# Patient Record
Sex: Female | Born: 1980 | Race: White | Hispanic: No | Marital: Married | Smoking: Never smoker
Health system: Southern US, Community
[De-identification: ages and names within clinical notes are randomized; demographics above are authoritative.]

## PROBLEM LIST (undated history)

## (undated) DIAGNOSIS — F419 Anxiety disorder, unspecified: Secondary | ICD-10-CM

## (undated) DIAGNOSIS — G43909 Migraine, unspecified, not intractable, without status migrainosus: Secondary | ICD-10-CM

## (undated) DIAGNOSIS — R87619 Unspecified abnormal cytological findings in specimens from cervix uteri: Secondary | ICD-10-CM

## (undated) DIAGNOSIS — D649 Anemia, unspecified: Secondary | ICD-10-CM

## (undated) DIAGNOSIS — D219 Benign neoplasm of connective and other soft tissue, unspecified: Secondary | ICD-10-CM

## (undated) DIAGNOSIS — R569 Unspecified convulsions: Secondary | ICD-10-CM

## (undated) DIAGNOSIS — I10 Essential (primary) hypertension: Secondary | ICD-10-CM

## (undated) HISTORY — DX: Unspecified abnormal cytological findings in specimens from cervix uteri: R87.619

## (undated) HISTORY — PX: COLPOSCOPY: SHX161

## (undated) HISTORY — PX: NO PAST SURGERIES: SHX2092

## (undated) HISTORY — DX: Essential (primary) hypertension: I10

## (undated) HISTORY — DX: Migraine, unspecified, not intractable, without status migrainosus: G43.909

## (undated) HISTORY — DX: Anxiety disorder, unspecified: F41.9

## (undated) HISTORY — DX: Anemia, unspecified: D64.9

## (undated) HISTORY — DX: Benign neoplasm of connective and other soft tissue, unspecified: D21.9

---

## 2012-04-01 NOTE — L&D Delivery Note (Signed)
Delivery Note At 4:55 AM a viable and healthy female was delivered via Vaginal, Spontaneous Delivery (Presentation: Left Occiput Anterior).  APGAR: 8, 9; weight .   Placenta status: Intact, Spontaneous.  Cord: 3 vessels with the following complications: None.  Cord pH: na  Anesthesia: Epidural  Episiotomy: None Lacerations: 1st degree Suture Repair: 2.0 3.0 vicryl rapide Est. Blood Loss (mL): 400  Mom to postpartum.  Baby to Couplet care / Skin to Skin.  Christina Lam 02/16/2013, 5:26 AM

## 2012-07-28 LAB — OB RESULTS CONSOLE HIV ANTIBODY (ROUTINE TESTING): HIV: NONREACTIVE

## 2012-07-28 LAB — OB RESULTS CONSOLE HEPATITIS B SURFACE ANTIGEN: Hepatitis B Surface Ag: NEGATIVE

## 2012-07-28 LAB — OB RESULTS CONSOLE RPR: RPR: NONREACTIVE

## 2013-02-08 ENCOUNTER — Telehealth: Payer: Self-pay | Admitting: "Endocrinology

## 2013-02-08 NOTE — Telephone Encounter (Signed)
Other's chart entered in error.

## 2013-02-16 ENCOUNTER — Encounter (HOSPITAL_COMMUNITY): Payer: Managed Care, Other (non HMO) | Admitting: Anesthesiology

## 2013-02-16 ENCOUNTER — Inpatient Hospital Stay (HOSPITAL_COMMUNITY): Payer: Managed Care, Other (non HMO) | Admitting: Anesthesiology

## 2013-02-16 ENCOUNTER — Encounter (HOSPITAL_COMMUNITY): Payer: Self-pay | Admitting: *Deleted

## 2013-02-16 ENCOUNTER — Inpatient Hospital Stay (HOSPITAL_COMMUNITY)
Admission: AD | Admit: 2013-02-16 | Discharge: 2013-02-18 | DRG: 775 | Disposition: A | Discharge status: Home / Self Care | Payer: Managed Care, Other (non HMO) | Source: Ambulatory Visit | Attending: Obstetrics and Gynecology | Admitting: Obstetrics and Gynecology

## 2013-02-16 HISTORY — DX: Unspecified convulsions: R56.9

## 2013-02-16 LAB — CBC
HCT: 38.5 % (ref 36.0–46.0)
Hemoglobin: 14.2 g/dL (ref 12.0–15.0)
MCH: 31.5 pg (ref 26.0–34.0)
MCV: 85.4 fL (ref 78.0–100.0)
RBC: 4.51 MIL/uL (ref 3.87–5.11)
WBC: 10.7 10*3/uL — ABNORMAL HIGH (ref 4.0–10.5)

## 2013-02-16 LAB — RPR: RPR Ser Ql: NONREACTIVE

## 2013-02-16 LAB — ABO/RH: ABO/RH(D): O POS

## 2013-02-16 MED ORDER — LACTATED RINGERS IV SOLN
500.0000 mL | Freq: Once | INTRAVENOUS | Status: AC
  Administered 2013-02-16: 500 mL via INTRAVENOUS

## 2013-02-16 MED ORDER — PHENYLEPHRINE 40 MCG/ML (10ML) SYRINGE FOR IV PUSH (FOR BLOOD PRESSURE SUPPORT)
80.0000 ug | PREFILLED_SYRINGE | INTRAVENOUS | Status: DC | PRN
  Filled 2013-02-16: qty 10

## 2013-02-16 MED ORDER — FLEET ENEMA 7-19 GM/118ML RE ENEM: 1.0000 | ENEMA | RECTAL | Status: DC | PRN

## 2013-02-16 MED ORDER — DIPHENHYDRAMINE HCL 25 MG PO CAPS: 25.0000 mg | ORAL_CAPSULE | Freq: Four times a day (QID) | ORAL | Status: DC | PRN

## 2013-02-16 MED ORDER — SENNOSIDES-DOCUSATE SODIUM 8.6-50 MG PO TABS
2.0000 | ORAL_TABLET | ORAL | Status: DC
  Administered 2013-02-16 – 2013-02-18 (×2): 2 via ORAL
  Filled 2013-02-16 (×2): qty 2

## 2013-02-16 MED ORDER — PHENYLEPHRINE 40 MCG/ML (10ML) SYRINGE FOR IV PUSH (FOR BLOOD PRESSURE SUPPORT): 80.0000 ug | PREFILLED_SYRINGE | INTRAVENOUS | Status: DC | PRN

## 2013-02-16 MED ORDER — OXYCODONE-ACETAMINOPHEN 5-325 MG PO TABS: 1.0000 | ORAL_TABLET | ORAL | Status: DC | PRN

## 2013-02-16 MED ORDER — EPHEDRINE 5 MG/ML INJ
10.0000 mg | INTRAVENOUS | Status: DC | PRN
Start: 2013-02-16 — End: 2013-02-16
  Filled 2013-02-16: qty 4

## 2013-02-16 MED ORDER — DIBUCAINE 1 % RE OINT
1.0000 "application " | TOPICAL_OINTMENT | RECTAL | Status: DC | PRN
  Filled 2013-02-16: qty 28

## 2013-02-16 MED ORDER — IBUPROFEN 600 MG PO TABS
600.0000 mg | ORAL_TABLET | Freq: Four times a day (QID) | ORAL | Status: DC
  Administered 2013-02-16 – 2013-02-18 (×9): 600 mg via ORAL
  Filled 2013-02-16 (×9): qty 1

## 2013-02-16 MED ORDER — ONDANSETRON HCL 4 MG/2ML IJ SOLN: 4.0000 mg | INTRAMUSCULAR | Status: DC | PRN

## 2013-02-16 MED ORDER — ONDANSETRON HCL 4 MG PO TABS: 4.0000 mg | ORAL_TABLET | ORAL | Status: DC | PRN

## 2013-02-16 MED ORDER — IBUPROFEN 600 MG PO TABS: 600.0000 mg | ORAL_TABLET | Freq: Four times a day (QID) | ORAL | Status: DC | PRN

## 2013-02-16 MED ORDER — LACTATED RINGERS IV SOLN
INTRAVENOUS | Status: DC
  Administered 2013-02-16: 125 mL/h via INTRAVENOUS

## 2013-02-16 MED ORDER — METHYLERGONOVINE MALEATE 0.2 MG PO TABS: 0.2000 mg | ORAL_TABLET | ORAL | Status: DC | PRN

## 2013-02-16 MED ORDER — PRENATAL MULTIVITAMIN CH
1.0000 | ORAL_TABLET | Freq: Every day | ORAL | Status: DC
  Administered 2013-02-16 – 2013-02-17 (×2): 1 via ORAL
  Filled 2013-02-16 (×2): qty 1

## 2013-02-16 MED ORDER — LIDOCAINE HCL (PF) 1 % IJ SOLN
INTRAMUSCULAR | Status: DC | PRN
  Administered 2013-02-16 (×2): 9 mL

## 2013-02-16 MED ORDER — TETANUS-DIPHTH-ACELL PERTUSSIS 5-2.5-18.5 LF-MCG/0.5 IM SUSP: 0.5000 mL | Freq: Once | INTRAMUSCULAR | Status: DC

## 2013-02-16 MED ORDER — SIMETHICONE 80 MG PO CHEW: 80.0000 mg | CHEWABLE_TABLET | ORAL | Status: DC | PRN

## 2013-02-16 MED ORDER — DIPHENHYDRAMINE HCL 50 MG/ML IJ SOLN: 12.5000 mg | INTRAMUSCULAR | Status: DC | PRN

## 2013-02-16 MED ORDER — OXYCODONE-ACETAMINOPHEN 5-325 MG PO TABS
1.0000 | ORAL_TABLET | ORAL | Status: DC | PRN
  Administered 2013-02-16 – 2013-02-17 (×5): 1 via ORAL
  Filled 2013-02-16 (×5): qty 1

## 2013-02-16 MED ORDER — CITRIC ACID-SODIUM CITRATE 334-500 MG/5ML PO SOLN: 30.0000 mL | ORAL | Status: DC | PRN

## 2013-02-16 MED ORDER — FENTANYL 2.5 MCG/ML BUPIVACAINE 1/10 % EPIDURAL INFUSION (WH - ANES)
INTRAMUSCULAR | Status: DC | PRN
  Administered 2013-02-16: 14 mL/h via EPIDURAL

## 2013-02-16 MED ORDER — EPHEDRINE 5 MG/ML INJ: 10.0000 mg | INTRAVENOUS | Status: DC | PRN

## 2013-02-16 MED ORDER — LANOLIN HYDROUS EX OINT: TOPICAL_OINTMENT | CUTANEOUS | Status: DC | PRN

## 2013-02-16 MED ORDER — OXYTOCIN BOLUS FROM INFUSION
500.0000 mL | INTRAVENOUS | Status: DC
  Administered 2013-02-16: 500 mL via INTRAVENOUS

## 2013-02-16 MED ORDER — METHYLERGONOVINE MALEATE 0.2 MG/ML IJ SOLN: 0.2000 mg | INTRAMUSCULAR | Status: DC | PRN

## 2013-02-16 MED ORDER — LIDOCAINE HCL (PF) 1 % IJ SOLN
30.0000 mL | INTRAMUSCULAR | Status: DC | PRN
  Filled 2013-02-16: qty 30

## 2013-02-16 MED ORDER — LACTATED RINGERS IV SOLN: 500.0000 mL | INTRAVENOUS | Status: DC | PRN

## 2013-02-16 MED ORDER — ONDANSETRON HCL 4 MG/2ML IJ SOLN: 4.0000 mg | Freq: Four times a day (QID) | INTRAMUSCULAR | Status: DC | PRN

## 2013-02-16 MED ORDER — OXYTOCIN 40 UNITS IN LACTATED RINGERS INFUSION - SIMPLE MED
62.5000 mL/h | INTRAVENOUS | Status: DC
  Filled 2013-02-16: qty 1000

## 2013-02-16 MED ORDER — ZOLPIDEM TARTRATE 5 MG PO TABS: 5.0000 mg | ORAL_TABLET | Freq: Every evening | ORAL | Status: DC | PRN

## 2013-02-16 MED ORDER — WITCH HAZEL-GLYCERIN EX PADS: 1.0000 "application " | MEDICATED_PAD | CUTANEOUS | Status: DC | PRN

## 2013-02-16 MED ORDER — ACETAMINOPHEN 325 MG PO TABS: 650.0000 mg | ORAL_TABLET | ORAL | Status: DC | PRN

## 2013-02-16 MED ORDER — BENZOCAINE-MENTHOL 20-0.5 % EX AERO
1.0000 "application " | INHALATION_SPRAY | CUTANEOUS | Status: DC | PRN
  Filled 2013-02-16: qty 56

## 2013-02-16 MED ORDER — FENTANYL 2.5 MCG/ML BUPIVACAINE 1/10 % EPIDURAL INFUSION (WH - ANES)
14.0000 mL/h | INTRAMUSCULAR | Status: DC | PRN
  Filled 2013-02-16: qty 125

## 2013-02-16 NOTE — Anesthesia Procedure Notes (Signed)
Epidural Patient location during procedure: OB Start time: 02/16/2013 3:09 AM End time: 02/16/2013 3:13 AM  Staffing Anesthesiologist: Leilani Able Performed by: anesthesiologist   Preanesthetic Checklist Completed: patient identified, surgical consent, pre-op evaluation, timeout performed, IV checked, risks and benefits discussed and monitors and equipment checked  Epidural Patient position: sitting Prep: site prepped and draped and DuraPrep Patient monitoring: continuous pulse ox and blood pressure Approach: midline Injection technique: LOR air  Needle:  Needle type: Tuohy  Needle gauge: 17 G Needle length: 9 cm and 9 Needle insertion depth: 5 cm cm Catheter type: closed end flexible Catheter size: 19 Gauge Catheter at skin depth: 10 cm Test dose: negative and Other  Assessment Sensory level: T9 Events: blood not aspirated, injection not painful, no injection resistance, negative IV test and no paresthesia  Additional Notes Reason for block:procedure for pain

## 2013-02-16 NOTE — MAU Note (Signed)
PT SAYS SHE HAS BEEN HURTING BAD 10PM.,  HAD APPOINTMENT  TODAY  VE- 2-3 CM- STRIPPED MEMBRANES.  DENIES HSV AND MRSA.

## 2013-02-16 NOTE — Progress Notes (Signed)
Patient ID: Christina Lam, female   DOB: March 24, 1981, 32 y.o.   MRN: [id number] INTERVAL NOTE:  S:   Lying in bed maintaining skin-to-skin with infant, min cramping, (+) voids, small bleed, denies HA/NV/dizziness  O:   VSS, AAO x 3, NAD  Fundus U  Scant lochia  A / P:   PPD #0  Stable post partum  Routine PP orders  Kenard Gower, MSN, CNM 02/16/2013, 9:13 AM

## 2013-02-16 NOTE — Anesthesia Postprocedure Evaluation (Signed)
  Anesthesia Post-op Note  Patient: Christina Lam  Procedure(s) Performed: * No procedures listed *  Patient Location: Mother/Baby  Anesthesia Type:Epidural  Level of Consciousness: awake, alert , oriented and patient cooperative  Airway and Oxygen Therapy: Patient Spontanous Breathing  Post-op Pain: mild  Post-op Assessment: Patient's Cardiovascular Status Stable, Respiratory Function Stable, Patent Airway, No signs of Nausea or vomiting, Adequate PO intake and Pain level controlled  Post-op Vital Signs: stable  Complications: No apparent anesthesia complications

## 2013-02-16 NOTE — H&P (Signed)
Christina Lam is a 32 y.o. female presenting for labor. Maternal Medical History:  Reason for admission: Contractions.   Contractions: Onset was less than 1 hour ago.   Frequency: regular.   Perceived severity is moderate.    Fetal activity: Perceived fetal activity is normal.   Last perceived fetal movement was within the past hour.    Prenatal Complications - Diabetes: none.    OB History   Grav Para Term Preterm Abortions TAB SAB Ect Mult Living   3 2 2       2      Past Medical History  Diagnosis Date  . Seizures     as child   Past Surgical History  Procedure Laterality Date  . No past surgeries     Family History: family history is not on file. Social History:  reports that she has never smoked. She has never used smokeless tobacco. She reports that she does not drink alcohol or use illicit drugs.   Prenatal Transfer Tool  Maternal Diabetes: No Genetic Screening: Normal Maternal Ultrasounds/Referrals: Normal Fetal Ultrasounds or other Referrals:  None Maternal Substance Abuse:  No Significant Maternal Medications:  None Significant Maternal Lab Results:  None Other Comments:  None  Review of Systems  Unable to perform ROS All other systems reviewed and are negative.    Dilation: 10 Effacement (%): 100 Station: +1 Exam by:: Lucas Mallow, RN Blood pressure 131/76, pulse 77, temperature 97.7 F (36.5 C), temperature source Oral, resp. rate 18, height 5' 4.5" (1.638 m), weight 84.029 kg (185 lb 4 oz), SpO2 100.00%. Maternal Exam:  Uterine Assessment: Contraction strength is moderate.  Contraction frequency is regular.   Abdomen: Patient reports no abdominal tenderness. Fetal presentation: vertex  Introitus: Normal vulva. Normal vagina.  Ferning test: not done.  Nitrazine test: not done. Amniotic fluid character: not assessed.  Pelvis: adequate for delivery.   Cervix: Cervix evaluated by digital exam.     Physical Exam  Nursing note and vitals  reviewed. Constitutional: She appears well-developed and well-nourished.  HENT:  Head: Normocephalic and atraumatic.  Eyes: Pupils are equal, round, and reactive to light.  Cardiovascular: Normal rate.   Respiratory: Effort normal.  GI: Soft.  Genitourinary: Vagina normal and uterus normal.  Musculoskeletal: Normal range of motion.  Neurological: She is alert.  Skin: Skin is warm and dry.    Prenatal labs: ABO, Rh: O/Positive/-- (11/18 0000) Antibody: Negative (11/18 0000) Rubella: Immune (04/29 0000) RPR: Nonreactive (04/29 0000)  HBsAg: Negative (04/29 0000)  HIV: Non-reactive (04/29 0000)  GBS: Negative (11/18 0000)   Assessment/Plan: Active labor Admit   Arch Methot J 02/16/2013, 5:24 AM

## 2013-02-16 NOTE — Anesthesia Preprocedure Evaluation (Signed)
Anesthesia Evaluation  Patient identified by MRN, date of birth, ID band Patient awake    Reviewed: Allergy & Precautions, H&P , NPO status , Patient's Chart, lab work & pertinent test results  Airway Mallampati: I TM Distance: >3 FB Neck ROM: full    Dental no notable dental hx.    Pulmonary neg pulmonary ROS,    Pulmonary exam normal       Cardiovascular negative cardio ROS      Neuro/Psych negative psych ROS   GI/Hepatic negative GI ROS, Neg liver ROS,   Endo/Other  negative endocrine ROS  Renal/GU negative Renal ROS     Musculoskeletal negative musculoskeletal ROS (+)   Abdominal Normal abdominal exam  (+)   Peds  Hematology negative hematology ROS (+)   Anesthesia Other Findings   Reproductive/Obstetrics (+) Pregnancy                           Anesthesia Physical Anesthesia Plan  ASA: II  Anesthesia Plan: Epidural   Post-op Pain Management:    Induction:   Airway Management Planned:   Additional Equipment:   Intra-op Plan:   Post-operative Plan:   Informed Consent: I have reviewed the patients History and Physical, chart, labs and discussed the procedure including the risks, benefits and alternatives for the proposed anesthesia with the patient or authorized representative who has indicated his/her understanding and acceptance.     Plan Discussed with:   Anesthesia Plan Comments:         Anesthesia Quick Evaluation  

## 2013-02-16 NOTE — Lactation Note (Signed)
This note was copied from the chart of Christina Lam. Lactation Consultation Note  Patient Name: Christina Lam [id number] Date: 02/16/2013 Reason for consult: Follow-up assessment Mom called with feeding cues after baby  Had a bath .  LC assessed breast tissue , noticed erect nipples , semi compress able areolas bilaterally.  baby has a high palate and recessed chin and moms tissue won't greet baby's hard palate. Sized mom for a #20 nipple shield ( noted to be a good fit , once the areola is more compress  able the #24 Nipple shield will accommodate the areola better. Baby latched for a few sucks and  Released. Per mom I feel like I just want to pump and bottle feed - LC set up a DEBP with instructions. And cleaning. Also mentioned to mom if she decided to latch to have the RN or  LC demo the curved tip  syringe with EBM or formula in the top of the NS and latch. Mentioned to mom she may find the baby latches  When there is incentive to suck . And post pump for 15-20 mins both breast together. @ this point baby sound asleep , LC recommended by 16 hours if the baby has not fed to have RN or LC spoon feed ,  Use the Nipple shield with EBM or formula or bottle . LC stressed the importance to [place] e the technique to feed that makes  her most comfortable and we the staff would support her. Mom seemed relieved .      Maternal Data Has patient been taught Hand Expression?: Yes  Feeding Feeding Type: Breast Fed  LATCH Score/Interventions                Intervention(s): Breastfeeding basics reviewed     Lactation Tools Discussed/Used Tools: Shells;Pump;Nipple Dorris Carnes (mom requested setting up a DEBP ) Nipple shield size: 20;24 (20 fits well , when areola more compress able #24 NS will fit better ) Shell Type: Inverted Breast pump type: Double-Electric Breast Pump (mom also wants to rent a pump ) Pump Review: Setup, frequency, and cleaning Initiated by:: MAI  Date initiated::  02/16/13   Consult Status Consult Status: Follow-up (see LC note ) Date: 02/16/13 Follow-up type: In-patient    Kathrin Greathouse 02/16/2013, 4:43 PM

## 2013-02-17 LAB — CBC
HCT: 35 % — ABNORMAL LOW (ref 36.0–46.0)
MCH: 30.8 pg (ref 26.0–34.0)
MCHC: 35.7 g/dL (ref 30.0–36.0)
MCV: 86.2 fL (ref 78.0–100.0)
Platelets: 141 10*3/uL — ABNORMAL LOW (ref 150–400)
RDW: 14.2 % (ref 11.5–15.5)

## 2013-02-17 NOTE — Progress Notes (Signed)
PPD 1 SVD  S:  Reports feeling well, just sore with breastfeeding             Tolerating po/ No nausea or vomiting             Bleeding is moderate             Pain controlled withMotrin and Percocet             Up ad lib / ambulatory / voiding QS  Newborn breast feeding  / Circumcision complete  O:               VS: BP 111/70  Pulse 71  Temp(Src) 98 F (36.7 C) (Oral)  Resp 18  Ht 5' 4.5" (1.638 m)  Wt 84.029 kg (185 lb 4 oz)  BMI 31.32 kg/m2  SpO2 100%   LABS:              Recent Labs  02/16/13 0225 02/17/13 0545  WBC 10.7* 11.3*  HGB 14.2 12.5  PLT 143* 141*               Blood type: --/--/O POS (11/18 0158)  Rubella: Immune (04/29 0000)                                 Physical Exam:             Alert and oriented X3  Lungs: Clear and unlabored  Heart: regular rate and rhythm / no mumurs  Abdomen: soft, non-tender, non-distended              Fundus: firm, non-tender, U-1  Perineum: no edema  Lochia: moderate to light rubra, no clots  Extremities: no edema, no calf pain or tenderness    A: PPD # 1   Doing well - stable status  P: Routine post partum orders  Encourage voiding q2 hr to support uterine tone  Anticipate d/c home tomorrow    Cyndee Brightly SNM 02/17/2013, 10:38 AM

## 2013-02-17 NOTE — Progress Notes (Signed)
Seen and agree - Terriann Difonzo CNM FACNM 

## 2013-02-18 ENCOUNTER — Encounter (HOSPITAL_COMMUNITY)
Admission: RE | Admit: 2013-02-18 | Discharge: 2013-02-18 | Disposition: A | Discharge status: Home / Self Care | Payer: Managed Care, Other (non HMO) | Source: Ambulatory Visit | Attending: Obstetrics & Gynecology | Admitting: Obstetrics & Gynecology

## 2013-02-18 DIAGNOSIS — O923 Agalactia: Secondary | ICD-10-CM | POA: Insufficient documentation

## 2013-02-18 MED ORDER — IBUPROFEN 600 MG PO TABS: 600.0000 mg | ORAL_TABLET | Freq: Four times a day (QID) | ORAL | Status: DC

## 2013-02-18 NOTE — Discharge Summary (Signed)
Obstetric Discharge Summary Reason for Admission: onset of labor Prenatal Procedures: ultrasound Intrapartum Procedures: spontaneous vaginal delivery Postpartum Procedures: none Complications-Operative and Postpartum: 1st degree perineal laceration Hemoglobin  Date Value Range Status  02/17/2013 12.5  12.0 - 15.0 g/dL Final     HCT  Date Value Range Status  02/17/2013 35.0* 36.0 - 46.0 % Final    Physical Exam:  General: alert and cooperative Lochia: appropriate Uterine Fundus: firm Incision: healing well, no significant drainage, no dehiscence, no significant erythema DVT Evaluation: No evidence of DVT seen on physical exam. Negative Homan's sign.  Discharge Diagnoses: Term Pregnancy-delivered  Discharge Information: Date: 02/18/2013 Activity: pelvic rest Diet: routine Medications: PNV and Ibuprofen Condition: stable Instructions: refer to practice specific booklet Discharge to: home Follow-up Information   Follow up with LAVOIE,MARIE-LYNE, MD. Schedule an appointment as soon as possible for a visit in 6 weeks.   Specialty:  Obstetrics and Gynecology   Contact information:   [address] [place] [address] [phone]       Newborn Data: Live born female on 02/16/2013 Birth Weight: 7 lb 3 oz (3260 g) APGAR: 8, 9  Home with mother.  Claris Pech, N 02/18/2013, 8:57 AM

## 2013-02-18 NOTE — Progress Notes (Signed)
PPD #2- SVD  Subjective:   Reports feeling tired Tolerating po/ No nausea or vomiting Bleeding is light Pain controlled with Motrin Up ad lib / ambulatory / voiding without problems Newborn: breastfeeding and pumping / Circumcision: done   Objective:   VS: VS:  Filed Vitals:   02/16/13 2000 02/17/13 0532 02/17/13 1800 02/18/13 0525  BP: 110/73 111/70 128/93 112/69  Pulse: 72 71 72 78  Temp: 98.6 F (37 C) 98 F (36.7 C) 97.8 F (36.6 C) 97.4 F (36.3 C)  TempSrc: Oral Oral Oral Oral  Resp: 18 18 18 16   Height:      Weight:      SpO2: 100%  100%     LABS:  Recent Labs  02/16/13 0225 02/17/13 0545  WBC 10.7* 11.3*  HGB 14.2 12.5  PLT 143* 141*   Blood type: --/--/O POS (11/18 0158) Rubella: Immune (04/29 0000)                I&O: Intake/Output   None     Physical Exam: Alert and oriented X3 Abdomen: soft, non-tender, non-distended  Fundus: firm, non-tender, U-1 Perineum: Well approximated, no significant erythema, edema, or drainage; healing well. Lochia: small Extremities: no edema, no calf pain or tenderness    Assessment: PPD # 2 [id number]/ S/P:SVD, 1st degree laceration Doing well - stable for discharge home   Plan: Discharge home RX's:  Ibuprofen 600mg  po Q 6 hrs prn pain #30 Refill x 0 Routine pp visit in [facility] booklet given    Donette Larry, N MSN, CNM 02/18/2013, 8:55 AM

## 2014-01-31 ENCOUNTER — Encounter (HOSPITAL_COMMUNITY): Payer: Self-pay | Admitting: *Deleted

## 2015-10-05 ENCOUNTER — Encounter (HOSPITAL_COMMUNITY): Payer: Self-pay

## 2015-10-05 ENCOUNTER — Emergency Department (HOSPITAL_COMMUNITY): Payer: Managed Care, Other (non HMO)

## 2015-10-05 ENCOUNTER — Emergency Department (HOSPITAL_COMMUNITY)
Admission: EM | Admit: 2015-10-05 | Discharge: 2015-10-06 | Disposition: A | Discharge status: Home / Self Care | Payer: Managed Care, Other (non HMO) | Attending: Emergency Medicine | Admitting: Emergency Medicine

## 2015-10-05 DIAGNOSIS — Z7982 Long term (current) use of aspirin: Secondary | ICD-10-CM | POA: Insufficient documentation

## 2015-10-05 DIAGNOSIS — R1031 Right lower quadrant pain: Secondary | ICD-10-CM | POA: Diagnosis not present

## 2015-10-05 DIAGNOSIS — R112 Nausea with vomiting, unspecified: Secondary | ICD-10-CM

## 2015-10-05 DIAGNOSIS — R197 Diarrhea, unspecified: Secondary | ICD-10-CM | POA: Diagnosis not present

## 2015-10-05 DIAGNOSIS — R109 Unspecified abdominal pain: Secondary | ICD-10-CM | POA: Diagnosis present

## 2015-10-05 LAB — LIPASE, BLOOD: Lipase: 23 U/L (ref 11–51)

## 2015-10-05 LAB — PREGNANCY, URINE: PREG TEST UR: NEGATIVE

## 2015-10-05 LAB — URINE MICROSCOPIC-ADD ON

## 2015-10-05 LAB — COMPREHENSIVE METABOLIC PANEL
ALBUMIN: 3.8 g/dL (ref 3.5–5.0)
ALK PHOS: 48 U/L (ref 38–126)
ALT: 12 U/L — AB (ref 14–54)
AST: 21 U/L (ref 15–41)
Anion gap: 8 (ref 5–15)
BILIRUBIN TOTAL: 0.5 mg/dL (ref 0.3–1.2)
BUN: 10 mg/dL (ref 6–20)
CALCIUM: 8.8 mg/dL — AB (ref 8.9–10.3)
CO2: 24 mmol/L (ref 22–32)
CREATININE: 0.85 mg/dL (ref 0.44–1.00)
Chloride: 105 mmol/L (ref 101–111)
GFR calc Af Amer: >60 mL/min (ref >60)
GFR calc non Af Amer: >60 mL/min (ref >60)
GLUCOSE: 89 mg/dL (ref 65–99)
Potassium: 3.2 mmol/L — ABNORMAL LOW (ref 3.5–5.1)
Sodium: 137 mmol/L (ref 135–145)
TOTAL PROTEIN: 7.2 g/dL (ref 6.5–8.1)

## 2015-10-05 LAB — URINALYSIS, ROUTINE W REFLEX MICROSCOPIC
Glucose, UA: NEGATIVE mg/dL
KETONES UR: 40 mg/dL — AB
Leukocytes, UA: NEGATIVE
NITRITE: NEGATIVE
PH: 6 (ref 5.0–8.0)
Protein, ur: 100 mg/dL — AB
Specific Gravity, Urine: 1.029 (ref 1.005–1.030)

## 2015-10-05 LAB — WET PREP, GENITAL
CLUE CELLS WET PREP: NONE SEEN
Sperm: NONE SEEN
Trich, Wet Prep: NONE SEEN
Yeast Wet Prep HPF POC: NONE SEEN

## 2015-10-05 LAB — CBC
HEMATOCRIT: 34.2 % — AB (ref 36.0–46.0)
Hemoglobin: 10.9 g/dL — ABNORMAL LOW (ref 12.0–15.0)
MCH: 23 pg — ABNORMAL LOW (ref 26.0–34.0)
MCHC: 31.9 g/dL (ref 30.0–36.0)
MCV: 72.3 fL — ABNORMAL LOW (ref 78.0–100.0)
PLATELETS: 171 10*3/uL (ref 150–400)
RBC: 4.73 MIL/uL (ref 3.87–5.11)
RDW: 17.5 % — AB (ref 11.5–15.5)
WBC: 4.4 10*3/uL (ref 4.0–10.5)

## 2015-10-05 MED ORDER — ACETAMINOPHEN 500 MG PO TABS
1000.0000 mg | ORAL_TABLET | Freq: Once | ORAL | Status: AC
  Administered 2015-10-05: 1000 mg via ORAL
  Filled 2015-10-05: qty 2

## 2015-10-05 MED ORDER — ONDANSETRON 4 MG PO TBDP: 4.0000 mg | ORAL_TABLET | Freq: Once | ORAL | Status: DC | PRN

## 2015-10-05 MED ORDER — DEXTROSE 5 % IV SOLN
1.0000 g | Freq: Once | INTRAVENOUS | Status: AC
  Administered 2015-10-05: 1 g via INTRAVENOUS
  Filled 2015-10-05: qty 10

## 2015-10-05 MED ORDER — ONDANSETRON HCL 4 MG/2ML IJ SOLN
4.0000 mg | INTRAMUSCULAR | Status: AC
  Administered 2015-10-05: 4 mg via INTRAVENOUS
  Filled 2015-10-05: qty 2

## 2015-10-05 MED ORDER — SODIUM CHLORIDE 0.9 % IV BOLUS (SEPSIS)
1000.0000 mL | Freq: Once | INTRAVENOUS | Status: AC
  Administered 2015-10-05: 1000 mL via INTRAVENOUS

## 2015-10-05 NOTE — ED Provider Notes (Signed)
CSN: [id number]     Arrival date & time 10/05/15  1753 History   First MD Initiated Contact with Patient 10/05/15 1954     Chief Complaint  Patient presents with  . Flank Pain  . Nausea    Christina Lam is a 35 y.o. female who presents to the emergency department complaining of 2 days of nausea, vomiting and diarrhea. She reports associated fevers and chills. Patient reports today she began having right-sided back pain that is nonradiating and constant. Patient reports she has vomited twice today and had 3 loose stools. She denies any abdominal pain. No previous abdominal surgeries. She reports her menstrual cycle began 2 days ago. She reports this menstrual cycle seems earlier than usual. No vaginal discharge. Patient last had ibuprofen yesterday. No treatments today. She denies urinary symptoms, hematuria, history kidney stones, chest pain, shortness of breath, abdominal pain, hematemesis, hematochezia or rashes.   Patient is a 35 y.o. female presenting with flank pain. The history is provided by the patient. No language interpreter was used.  Flank Pain Associated symptoms include chills, a fever, nausea and vomiting. Pertinent negatives include no abdominal pain, chest pain, congestion, coughing, headaches, neck pain, rash or sore throat.    Past Medical History  Diagnosis Date  . Seizures ([place])     as child  . Postpartum care following vaginal delivery (11/18) 02/16/2013   Past Surgical History  Procedure Laterality Date  . No past surgeries     No family history on file. Social History  Substance Use Topics  . Smoking status: Never Smoker   . Smokeless tobacco: Never Used  . Alcohol Use: Yes   OB History    Gravida Para Term Preterm AB TAB SAB Ectopic Multiple Living   3 3 3       3      Review of Systems  Constitutional: Positive for fever and chills.  HENT: Negative for congestion and sore throat.   Eyes: Negative for visual disturbance.  Respiratory: Negative for cough  and shortness of breath.   Cardiovascular: Negative for chest pain.  Gastrointestinal: Positive for nausea, vomiting and diarrhea. Negative for abdominal pain and blood in stool.  Genitourinary: Positive for vaginal bleeding. Negative for dysuria, urgency, frequency, flank pain, vaginal discharge and difficulty urinating.  Musculoskeletal: Positive for back pain. Negative for neck pain.  Skin: Negative for rash.  Neurological: Negative for light-headedness and headaches.      Allergies  Penicillins  Home Medications   Prior to Admission medications   Medication Sig Start Date End Date Taking? Authorizing Provider  aspirin-acetaminophen-caffeine (EXCEDRIN MIGRAINE) [phone] MG tablet Take 1-2 tablets by mouth every 6 (six) hours as needed for headache or migraine.   Yes Historical Provider, MD  etonogestrel (NEXPLANON) 68 MG IMPL implant 1 each by Subdermal route once.   Yes Historical Provider, MD  ibuprofen (ADVIL,MOTRIN) 200 MG tablet Take 800 mg by mouth every 6 (six) hours as needed for headache, mild pain, moderate pain or cramping.   Yes Historical Provider, MD  ibuprofen (ADVIL,MOTRIN) 600 MG tablet Take 1 tablet (600 mg total) by mouth every 6 (six) hours. Patient not taking: Reported on 10/05/2015 02/18/13   Julianne Handler, CNM   BP 118/68 mmHg  Pulse 61  Temp(Src) 100 F (37.8 C) (Oral)  Resp 16  Ht 5\' 5"  (1.651 m)  Wt 72.576 kg  BMI 26.63 kg/m2  SpO2 99%  LMP 10/04/2015 (Exact Date) Physical Exam  Constitutional: She appears well-developed and well-nourished.  No distress.  Nontoxic appearing.  HENT:  Head: Normocephalic and atraumatic.  Mouth/Throat: Oropharynx is clear and moist.  Eyes: Conjunctivae are normal. Pupils are equal, round, and reactive to light. Right eye exhibits no discharge. Left eye exhibits no discharge.  Neck: Neck supple.  Cardiovascular: Normal rate, regular rhythm, normal heart sounds and intact distal pulses.  Exam reveals no gallop and no  friction rub.   No murmur heard. Pulmonary/Chest: Effort normal and breath sounds normal. No respiratory distress. She has no wheezes. She has no rales.  Abdominal: Soft. Bowel sounds are normal. She exhibits no distension and no mass. There is tenderness. There is no rebound and no guarding.  Abdomen is soft. Bowel sounds are present. Patient has mild right lower quadrant tenderness to palpation in right CVA tenderness to palpation. No rebound tenderness. No Rovsing sign. No psoas or obturator sign.  Genitourinary: No vaginal discharge found.  Pelvic exam performed by me with female PA-C chaperone. Scant vaginal bleeding. No vaginal discharge. Cervix is closed. No cervical motion tenderness. No adnexal tenderness or fullness. No suprapubic tenderness. No external rashes or lesions noted.  Musculoskeletal: She exhibits no edema.  Lymphadenopathy:    She has no cervical adenopathy.  Neurological: She is alert. Coordination normal.  Skin: Skin is warm and dry. No rash noted. She is not diaphoretic. No erythema. No pallor.  Psychiatric: She has a normal mood and affect. Her behavior is normal.  Nursing note and vitals reviewed.   ED Course  Procedures (including critical care time) Labs Review Labs Reviewed  WET PREP, GENITAL - Abnormal; Notable for the following:    WBC, Wet Prep HPF POC MANY (*)    All other components within normal limits  COMPREHENSIVE METABOLIC PANEL - Abnormal; Notable for the following:    Potassium 3.2 (*)    Calcium 8.8 (*)    ALT 12 (*)    All other components within normal limits  CBC - Abnormal; Notable for the following:    Hemoglobin 10.9 (*)    HCT 34.2 (*)    MCV 72.3 (*)    MCH 23.0 (*)    RDW 17.5 (*)    All other components within normal limits  URINALYSIS, ROUTINE W REFLEX MICROSCOPIC (NOT AT [facility]) - Abnormal; Notable for the following:    Hgb urine dipstick SMALL (*)    Bilirubin Urine SMALL (*)    Ketones, ur 40 (*)    Protein, ur 100 (*)     All other components within normal limits  URINE MICROSCOPIC-ADD ON - Abnormal; Notable for the following:    Squamous Epithelial / LPF 0-5 (*)    Bacteria, UA MANY (*)    All other components within normal limits  URINE CULTURE  LIPASE, BLOOD  PREGNANCY, URINE  GC/CHLAMYDIA PROBE AMP ([place]) NOT AT [facility]    Imaging Review US Renal  10/05/2015  CLINICAL DATA:  Right flank pain.  Nausea and vomiting. EXAM: RENAL / URINARY TRACT ULTRASOUND COMPLETE COMPARISON:  None. FINDINGS: Right Kidney: Length: 11.2 cm. Echogenicity within normal limits. No mass or hydronephrosis visualized. No shadowing echogenic stone. Left Kidney: Length: 10.1 cm. Echogenicity within normal limits. No mass or hydronephrosis visualized. No shadowing echogenic stone. Bladder: Appears normal for degree of bladder distention. Both ureteral jets are visualized. IMPRESSION: Normal renal ultrasound. Electronically Signed   By: Jeb Levering M.D.   On: 10/05/2015 22:05   I have personally reviewed and evaluated these images and lab results as part  of my medical decision-making.   EKG Interpretation None      Filed Vitals:   10/05/15 2230 10/06/15 0042 10/06/15 0045 10/06/15 0100  BP: 121/73 112/75 122/68 118/68  Pulse: 74 62 62 61  Temp:      TempSrc:      Resp:    16  Height:      Weight:      SpO2: 99% 98% 99% 99%     MDM   Meds given in ED:  Medications  iopamidol (ISOVUE-300) 61 % injection (not administered)  sodium chloride 0.9 % bolus 1,000 mL (0 mLs Intravenous Stopped 10/05/15 2210)  acetaminophen (TYLENOL) tablet 1,000 mg (1,000 mg Oral Given 10/05/15 2035)  ondansetron (ZOFRAN) injection 4 mg (4 mg Intravenous Given 10/05/15 2045)  cefTRIAXone (ROCEPHIN) 1 g in dextrose 5 % 50 mL IVPB (0 g Intravenous Stopped 10/05/15 2124)    New Prescriptions   No medications on file    Final diagnoses:  Right flank pain  Right lower quadrant abdominal pain  Nausea vomiting and diarrhea   This is a 35  y.o. female who presents to the emergency department complaining of 2 days of nausea, vomiting and diarrhea. She reports associated fevers and chills. Patient reports today she began having right-sided back pain that is nonradiating and constant. Patient reports she has vomited twice today and had 3 loose stools. She denies any abdominal pain. No previous abdominal surgeries. She reports her menstrual cycle began 2 days ago. She reports this menstrual cycle seems earlier than usual. No vaginal discharge. Patient last had ibuprofen yesterday. No treatments today. She denies urinary symptoms, hematuria, history kidney stones.  On arrival to the emergency department the patient has a temperature of 100.0. On my evaluation the patient is nontoxic appearing. Her abdomen is soft and she has a right lower quadrant tenderness to palpation. She has right CVA tenderness. Pregnancy test is negative. Urinalysis is nitrite and leukocyte negative with many bacteria and 0-5 white blood cells. Lipase is within normal limits. CBC and CMP are unremarkable. Renal ultrasound is unremarkable. Pelvic exam is unremarkable. On repeat exams patient has no right lower quadrant or right adnexal tenderness. No cervical motion tenderness. Wet prep reveals many white blood cells. Will obtain CT abdomen and pelvis with contrast to rule out appendicitis versus colitis or other acute abnormality. At shift change patient is awaiting CT abdomen and pelvis. If this exam is unremarkable I would likely treat with Keflex 4 times a day for 10 days for possible pyelonephritis. Patient care handed off to Dr. Venora Maples who will disposition the patient accordingly after CT scan.      Waynetta Pean, PA-C 10/06/15 0134  Jola Schmidt, MD 10/06/15 [place], MD 10/06/15 0530

## 2015-10-05 NOTE — ED Notes (Signed)
Per Pt, pt started to have nausea, vomiting, diarrhea starting yesterday morning. Reports continuing throughout the last day. Pt reports to have started to have left lower back pain this morning.

## 2015-10-05 NOTE — ED Notes (Signed)
Patient transported to Ultrasound 

## 2015-10-06 ENCOUNTER — Emergency Department (HOSPITAL_COMMUNITY): Payer: Managed Care, Other (non HMO)

## 2015-10-06 LAB — GC/CHLAMYDIA PROBE AMP (~~LOC~~) NOT AT ARMC
CHLAMYDIA, DNA PROBE: NEGATIVE
Neisseria Gonorrhea: NEGATIVE

## 2015-10-06 MED ORDER — IOPAMIDOL (ISOVUE-300) INJECTION 61%
INTRAVENOUS | Status: AC
  Administered 2015-10-06: 100 mL
  Filled 2015-10-06: qty 100

## 2015-10-06 MED ORDER — ACETAMINOPHEN 500 MG PO TABS
1000.0000 mg | ORAL_TABLET | Freq: Once | ORAL | Status: AC
  Administered 2015-10-06: 1000 mg via ORAL
  Filled 2015-10-06: qty 2

## 2015-10-06 MED ORDER — IBUPROFEN 600 MG PO TABS: 600.0000 mg | ORAL_TABLET | Freq: Three times a day (TID) | ORAL | Status: AC | PRN

## 2015-10-06 MED ORDER — ONDANSETRON 8 MG PO TBDP: 8.0000 mg | ORAL_TABLET | Freq: Three times a day (TID) | ORAL | Status: AC | PRN

## 2015-10-06 NOTE — Discharge Instructions (Signed)

## 2015-10-06 NOTE — ED Notes (Signed)
Patient transported to Ultrasound 

## 2015-10-06 NOTE — ED Notes (Signed)
Patient transported to CT 

## 2015-10-07 LAB — URINE CULTURE: Culture: 1000 — AB

## 2016-11-01 IMAGING — US US ABDOMEN LIMITED
1 series · 14 of 25 positions shown · non-contrast
Comparison: CT of the abdomen and pelvis performed earlier today at
[DATE] a.m.

CLINICAL DATA: Acute onset of right upper quadrant abdominal pain.
Initial encounter.

EXAM:
US ABDOMEN LIMITED - RIGHT UPPER QUADRANT

[Series 1: us abdomen limited · 0.22mm/px · 14 of 33 slices shown]
[im 1/33]
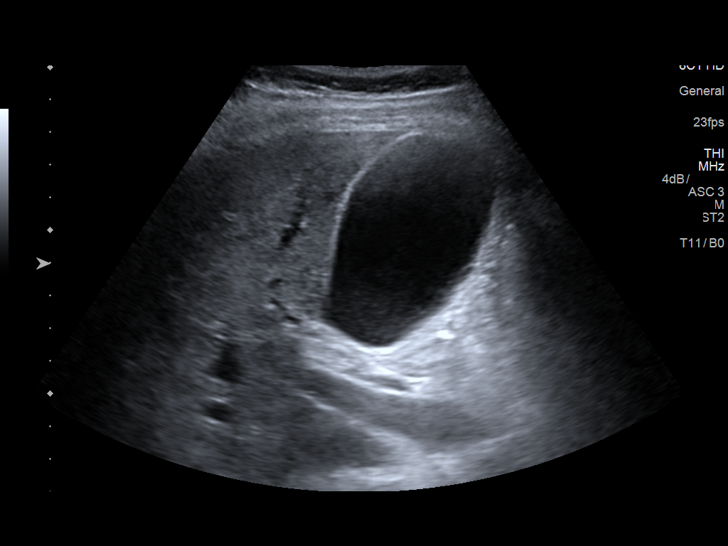
[im 3/33]
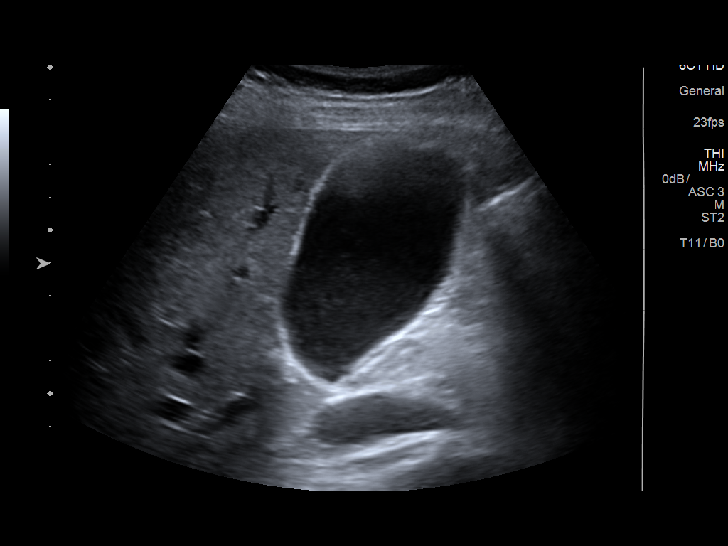
[im 6/33]
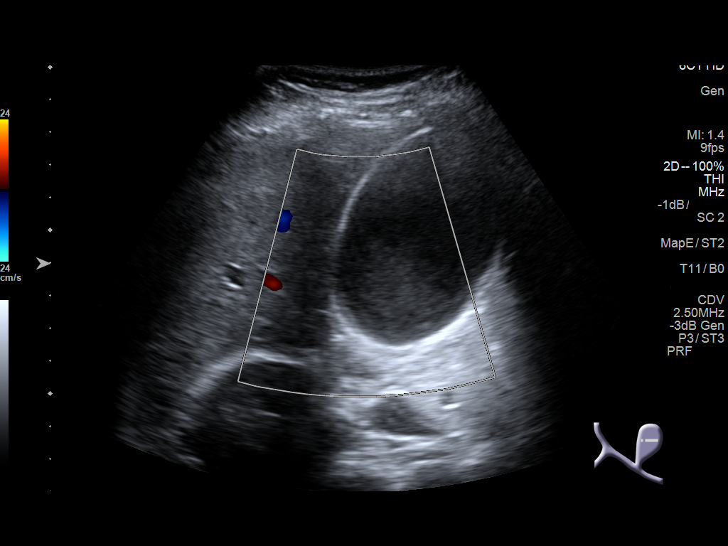
[im 9/33]
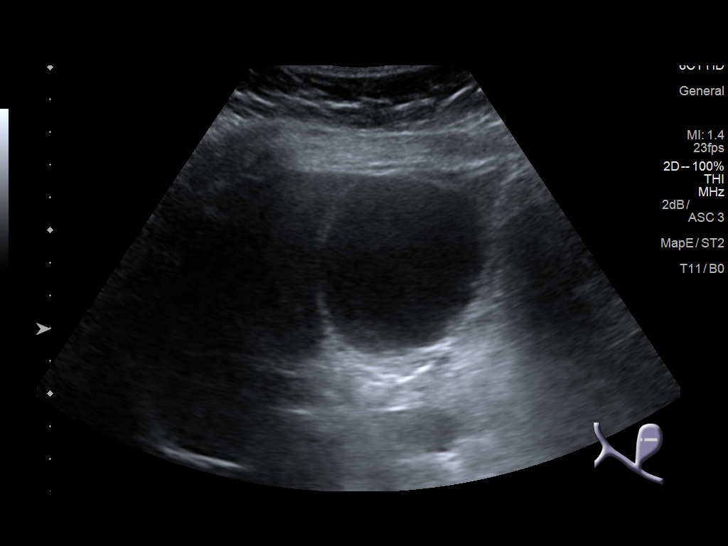
[im 11/33]
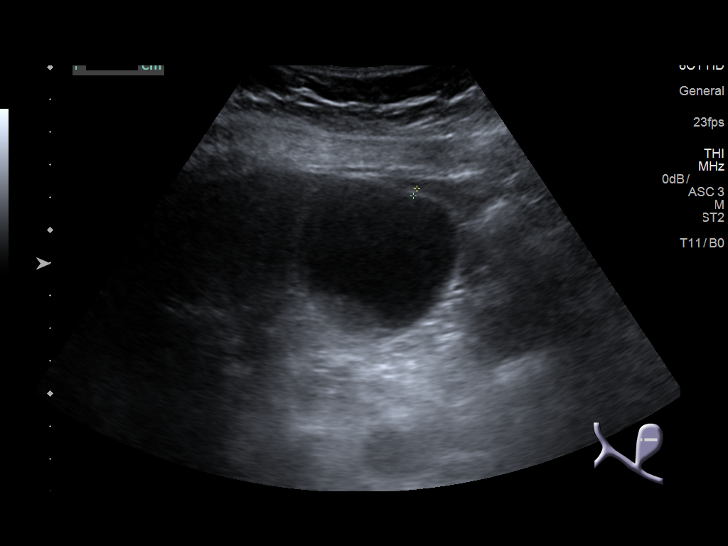
[im 13/33]
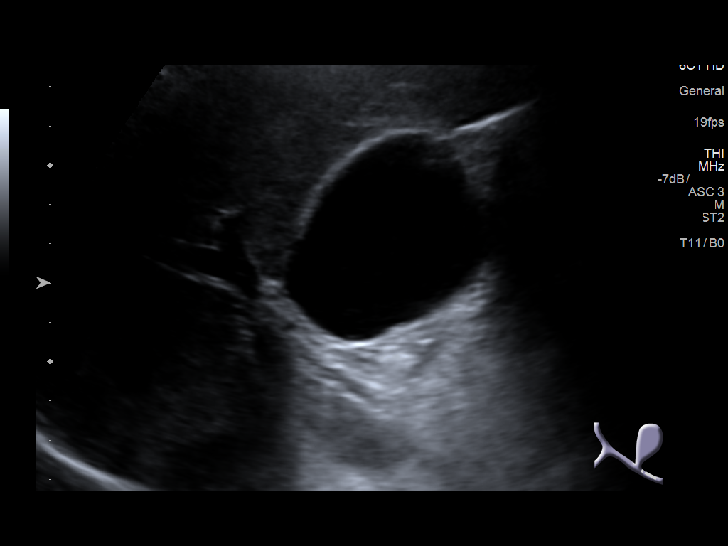
[im 15/33]
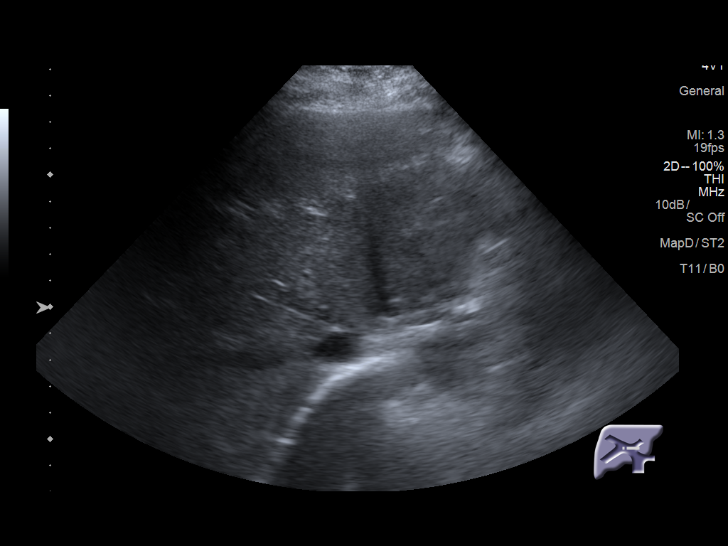
[im 18/33]
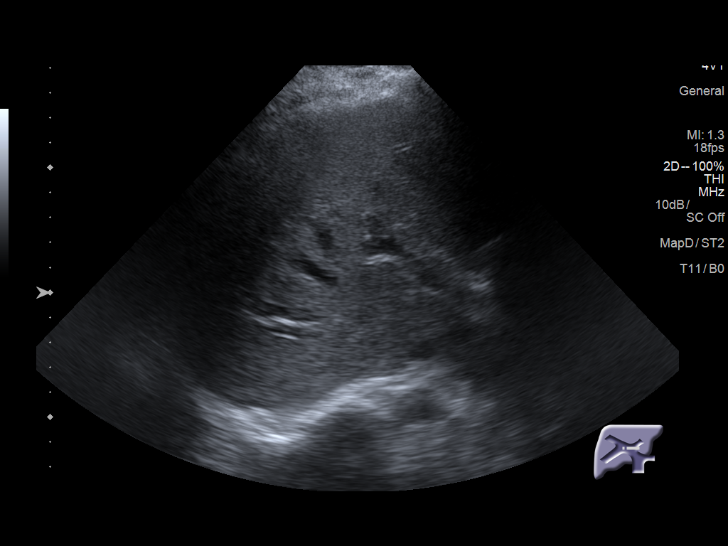
[im 21/33]
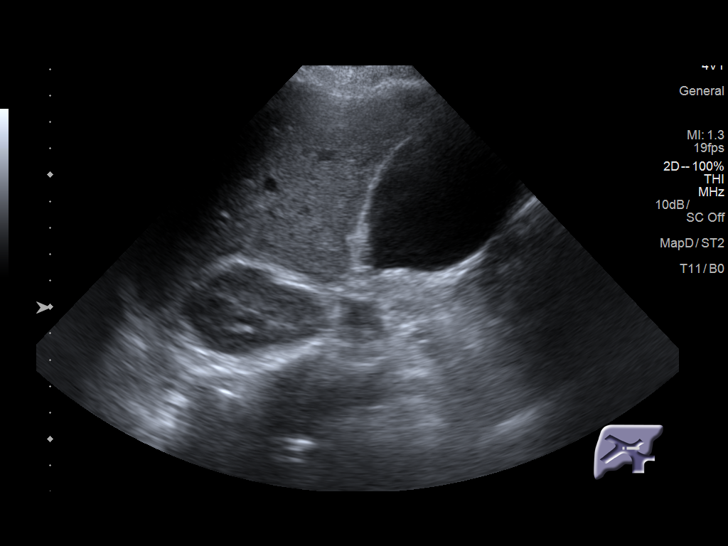
[im 22/33]
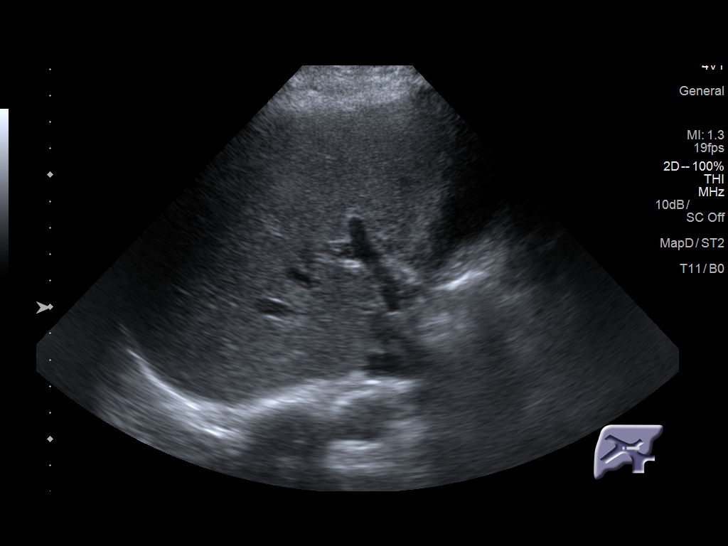
[im 25/33]
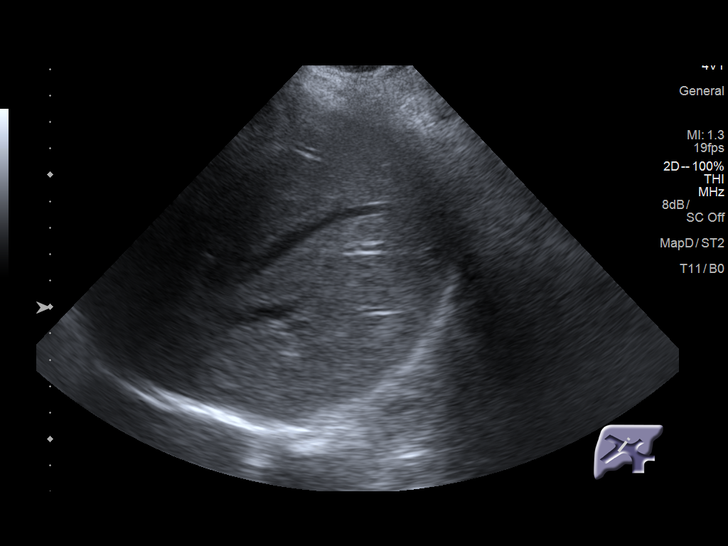
[im 27/33]
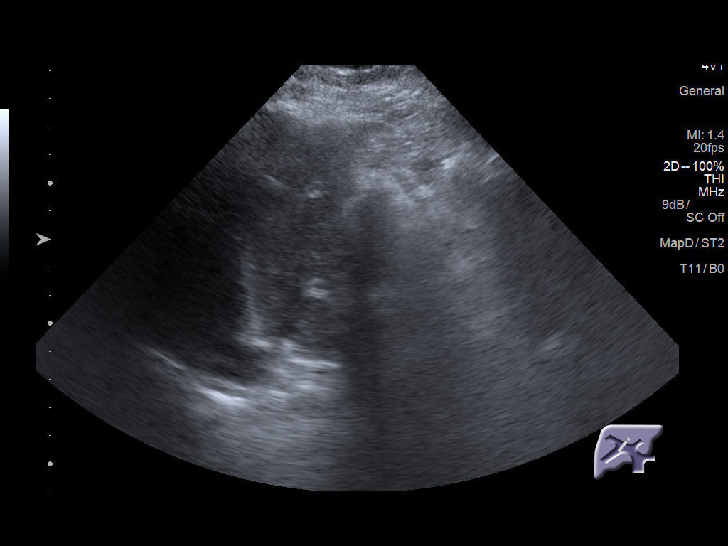
[im 30/33]
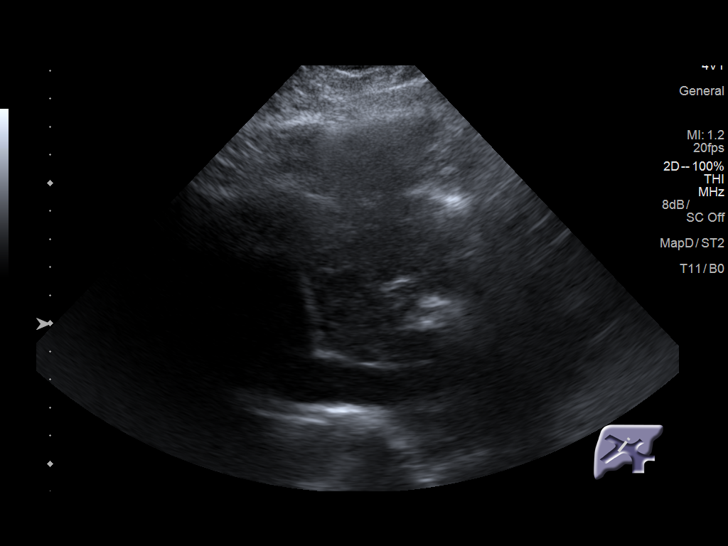
[im 33/33]
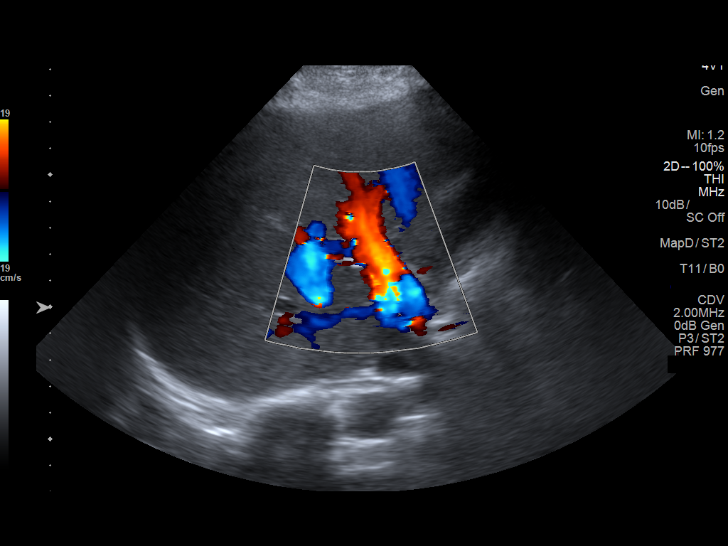

[14 of 25 positions shown; findings below may reference images not displayed]

FINDINGS: Gallbladder:

No gallstones or wall thickening visualized. Mild echogenic sludge
is noted within the gallbladder. No sonographic Murphy sign noted by
sonographer.

Common bile duct:

Diameter: 0.4 cm, within normal limits in caliber.

Liver:

No focal lesion identified. Within normal limits in parenchymal
echogenicity.
IMPRESSION: 1. No acute abnormality seen at the right upper quadrant.
2. Mild echogenic sludge within the gallbladder.

## 2018-01-03 IMAGING — CT CT ABD-PELV W/ CM
2 of 4 series · 11 of 46 positions shown, 12 images · IV contrast (iopamidol)
Comparison: Renal ultrasound yesterday.

CLINICAL DATA: Right lower quadrant abdominal pain.

EXAM:
CT ABDOMEN AND PELVIS WITH CONTRAST
TECHNIQUE: Multidetector CT imaging of the abdomen and pelvis was performed
using the standard protocol following bolus administration of
intravenous contrast.
CONTRAST:  100mL VW7NX2-SDD IOPAMIDOL (VW7NX2-SDD) INJECTION 61%

[Series 201: routine, idose (2) · axial · 0.78mm/px · z∈[-613,-248]mm · 8 of 89 slices shown, 9 images]
[im 8/89  soft-tissue]
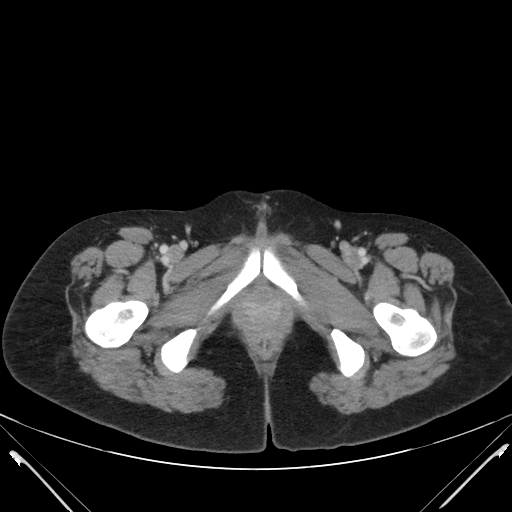
[im 8/89  bone]
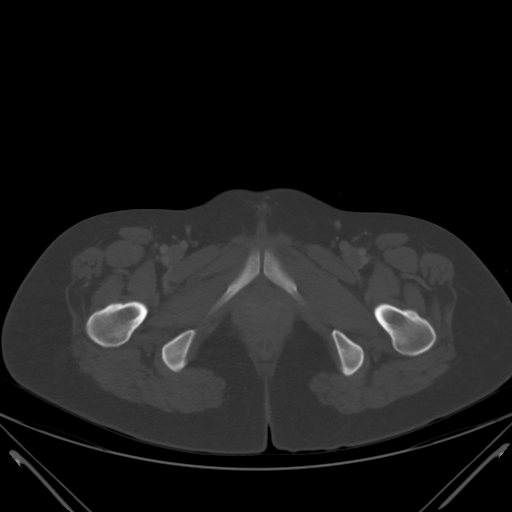
[im 18/89  soft-tissue]
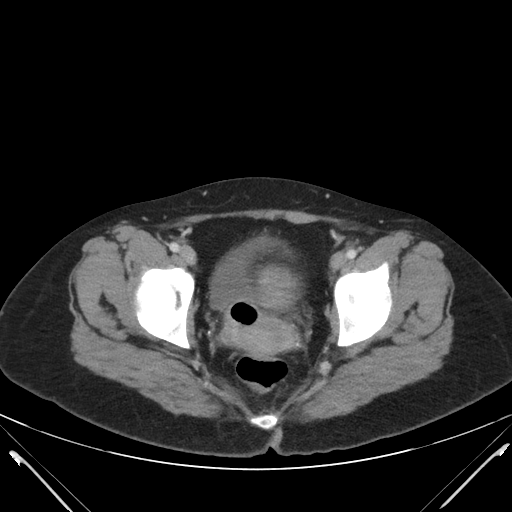
[im 29/89  soft-tissue]
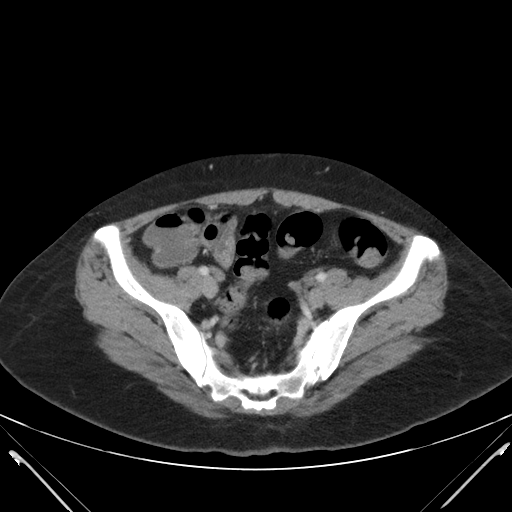
[im 39/89  soft-tissue]
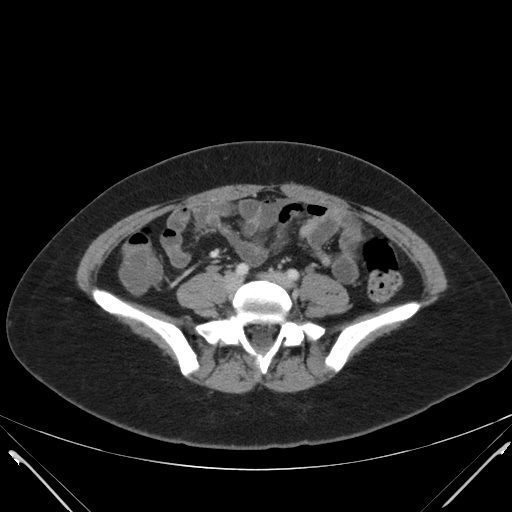
[im 50/89  soft-tissue]
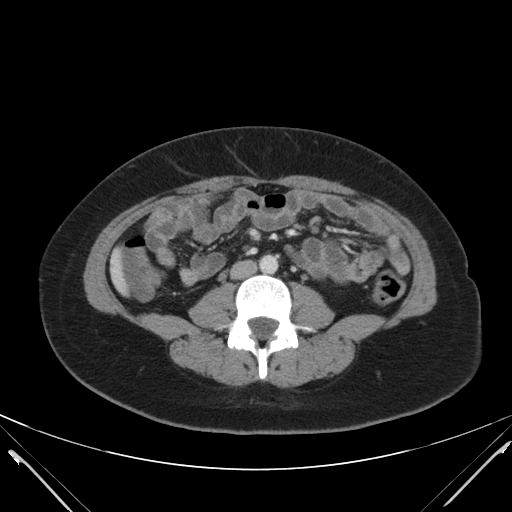
[im 60/89  soft-tissue]
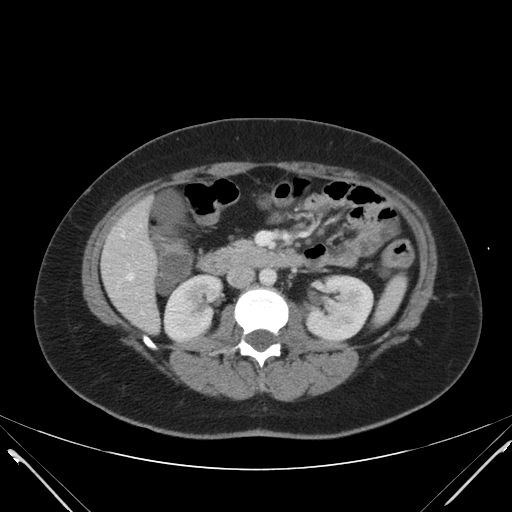
[im 71/89  soft-tissue]
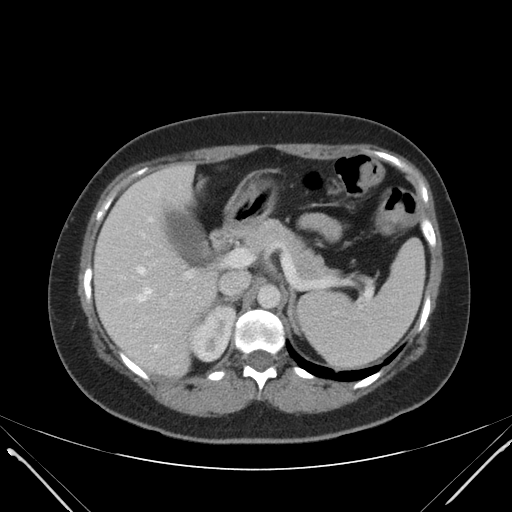
[im 81/89  soft-tissue]
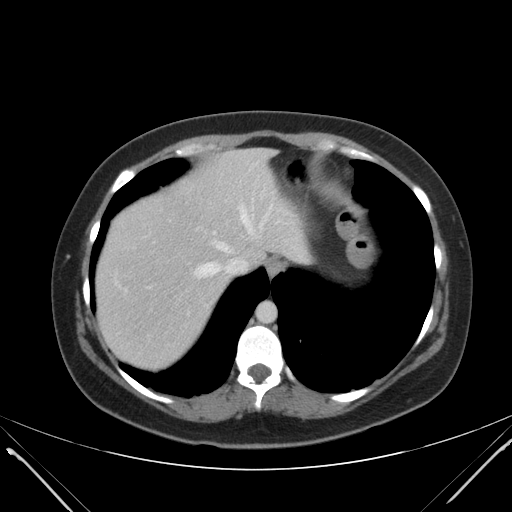

[Series 203: coronals, idose (2) · coronal · 0.45mm/px · 3 of 112 slices shown]
[im 38/112  soft-tissue]
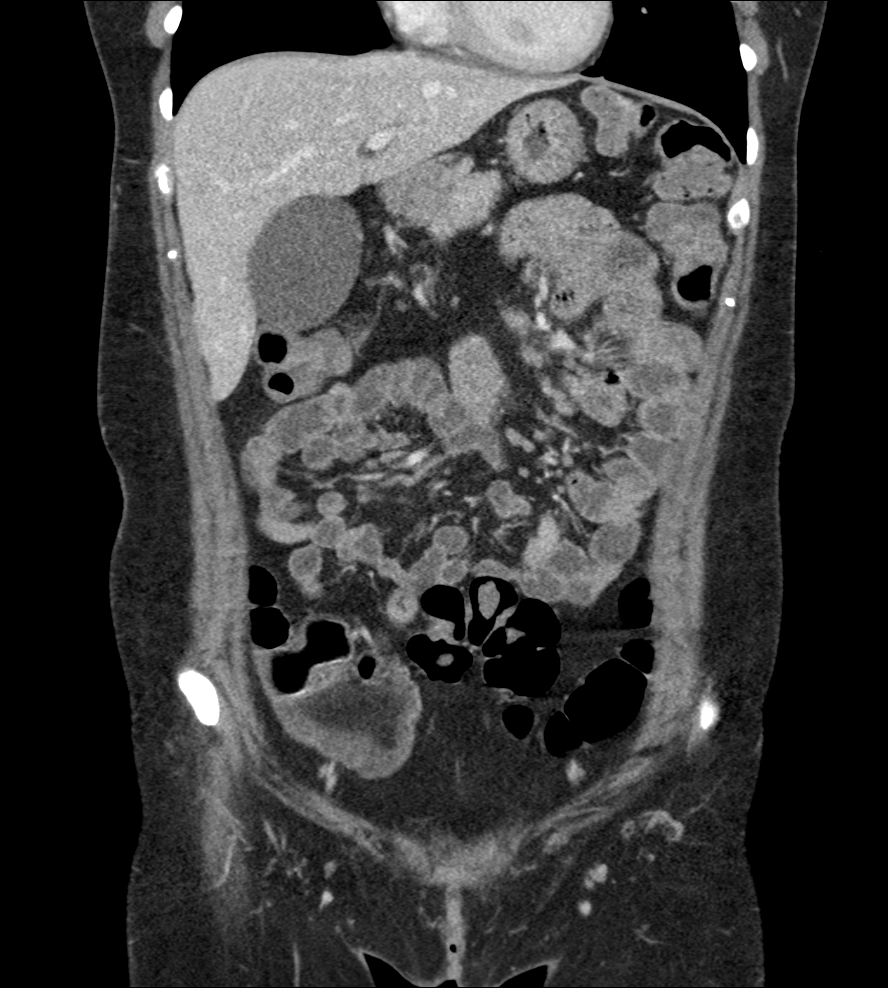
[im 50/112  soft-tissue]
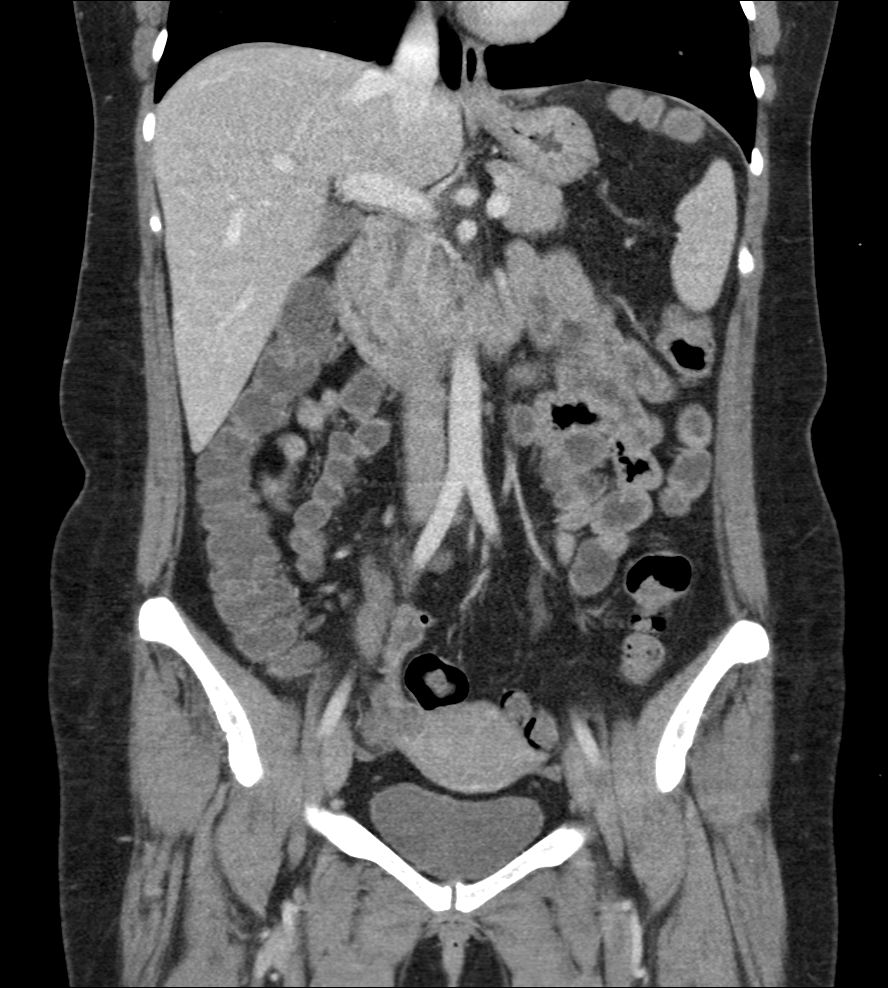
[im 62/112  soft-tissue]
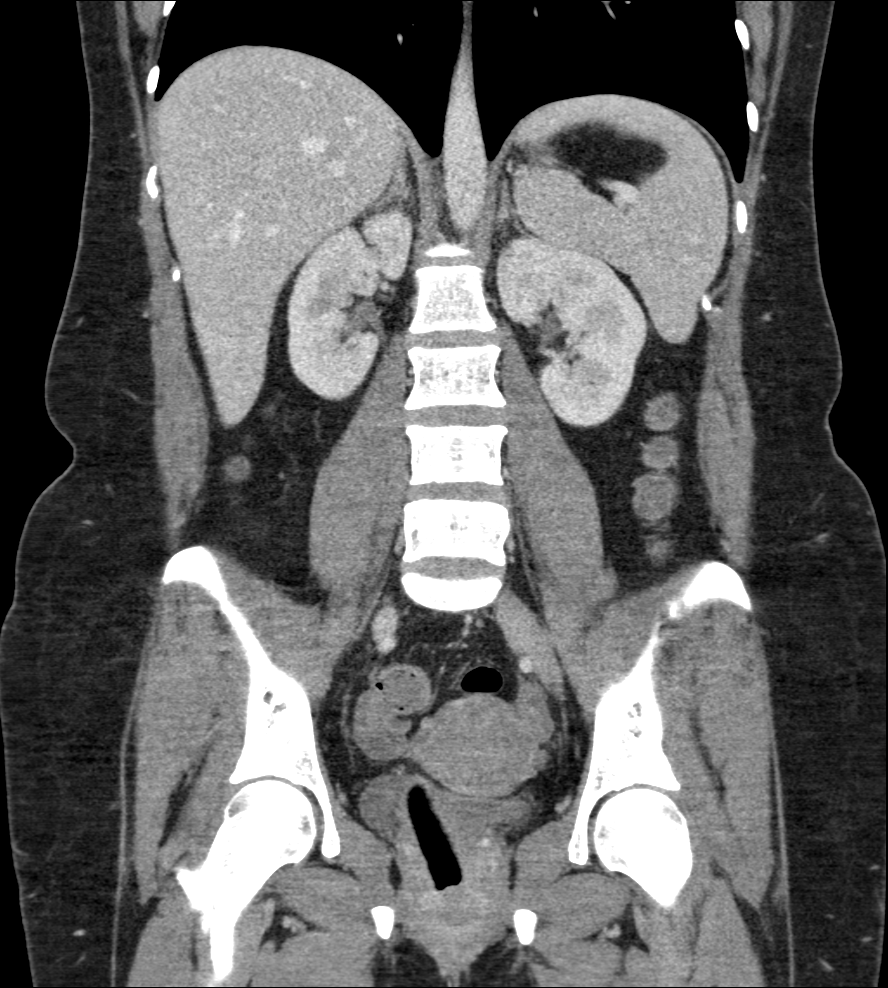

[11 of 46 positions shown; findings below may reference images not displayed]

FINDINGS: Lower chest:  The included lung bases are clear.

Liver: Unremarkable.

Hepatobiliary: Mild gallbladder distention. No pericholecystic
inflammation. No calcified gallstone, question of cholesterol
stones. No biliary dilatation.

Pancreas: No ductal dilatation or inflammation.

Spleen: Normal.  Splenule at the hilum.

Adrenal glands: No nodule.

Kidneys: Symmetric renal enhancement. No hydronephrosis. No
perinephric edema.

Stomach/Bowel: Stomach is decompressed. Small bowel is normal in
caliber, fluid-filled diffusely. Liquid stool in the cecum,
ascending, and proximal transverse colon. Formed stool in the more
distal colon. No colonic wall thickening. The appendix is normal,
best appreciated on the coronal reformats 40-55.

Vascular/Lymphatic: Prominent central mesenteric lymph nodes. No
retroperitoneal adenopathy. Abdominal aorta is normal in caliber.

Reproductive: The uterus is unremarkable. Tampon in place. Ovaries
symmetric in size. Trace free fluid in the pelvis is physiologic.

Bladder: Physiologically distended, no wall thickening.

Other: No free air, abdominal ascites, or intra-abdominal fluid
collection.

Musculoskeletal: There are no acute or suspicious osseous
abnormalities.
IMPRESSION: 1. Normal appendix.
2. Mild gallbladder distention, no pericholecystic inflammation.
There are no calcified gallstones, however question of cholesterol
stones. Right upper quadrant ultrasound could be considered given
patient's history of right-sided pain.
3. Fluid-filled small bowel and proximal colon. No enteric
inflammation. There are prominent central mesenteric nodes. While
nonspecific, findings can be seen with a primary bowel process such
as celiac disease.

## 2022-04-23 ENCOUNTER — Encounter: Payer: Self-pay | Admitting: Obstetrics & Gynecology

## 2022-04-23 ENCOUNTER — Ambulatory Visit: Payer: 59 | Admitting: Obstetrics & Gynecology

## 2022-04-23 ENCOUNTER — Other Ambulatory Visit (HOSPITAL_COMMUNITY)
Admission: RE | Admit: 2022-04-23 | Discharge: 2022-04-23 | Disposition: A | Discharge status: Home / Self Care | Payer: 59 | Source: Ambulatory Visit | Attending: Obstetrics & Gynecology | Admitting: Obstetrics & Gynecology

## 2022-04-23 VITALS — BP 114/68 | HR 65 | Ht 64.75 in | Wt 156.0 lb

## 2022-04-23 DIAGNOSIS — Z01419 Encounter for gynecological examination (general) (routine) without abnormal findings: Secondary | ICD-10-CM

## 2022-04-23 DIAGNOSIS — N920 Excessive and frequent menstruation with regular cycle: Secondary | ICD-10-CM

## 2022-04-23 DIAGNOSIS — Z789 Other specified health status: Secondary | ICD-10-CM

## 2022-04-23 NOTE — Progress Notes (Signed)
Christina Lam Jul 01, 1980 [id number]   History:    42 y.o. G3P3L3 Married  RP:  New patient presenting for annual gyn exam   HPI: Longstanding heavy menses monthly.  Heavy flow lasting about 7 days.  Moderate cramping.  Pelvic US Neg in 05/2020. No improvement in the past with BCPs or Nexplanon.  Currently using condoms.  Never tried the Progesterone IUD.  Paternal cousin had Endometrial Ca in early 67's, patient worried about that family history.  No recent h/o abnormal Pap.  Remote Colposcopy, no treatment required.  Breasts normal.  Will start screening mammo this year at 40.  BMI 26.16.  Urine/BMs normal.  Flu vaccine at PCP.  Past medical history,surgical history, family history and social history were all reviewed and documented in the EPIC chart.  Gynecologic History Patient's last menstrual period was 04/06/2022 (exact date).  Obstetric History OB History  Gravida Para Term Preterm AB Living  '3 3 3     3  '$ SAB IAB Ectopic Multiple Live Births          3    # Outcome Date GA Lbr Len/2nd Weight Sex Delivery Anes PTL Lv  3 Term 02/16/13 50w3d07:20 / 00:35 7 lb 3 oz (3.26 kg) M Vag-Spont EPI  LIV  2 Term      Vag-Spont   LIV  1 Term      Vag-Spont   LIV     ROS: A ROS was performed and pertinent positives and negatives are included in the history. GENERAL: No fevers or chills. HEENT: No change in vision, no earache, sore throat or sinus congestion. NECK: No pain or stiffness. CARDIOVASCULAR: No chest pain or pressure. No palpitations. PULMONARY: No shortness of breath, cough or wheeze. GASTROINTESTINAL: No abdominal pain, nausea, vomiting or diarrhea, melena or bright red blood per rectum. GENITOURINARY: No urinary frequency, urgency, hesitancy or dysuria. MUSCULOSKELETAL: No joint or muscle pain, no back pain, no recent trauma. DERMATOLOGIC: No rash, no itching, no lesions. ENDOCRINE: No polyuria, polydipsia, no heat or cold intolerance. No recent change in weight. HEMATOLOGICAL: No  anemia or easy bruising or bleeding. NEUROLOGIC: No headache, seizures, numbness, tingling or weakness. PSYCHIATRIC: No depression, no loss of interest in normal activity or change in sleep pattern.     Exam:   BP 114/68   Pulse 65   Ht 5' 4.75" (1.645 m)   Wt 156 lb (70.8 kg)   LMP 04/06/2022 (Exact Date)   SpO2 97%   BMI 26.16 kg/m   Body mass index is 26.16 kg/m.  General appearance : Well developed well nourished female. No acute distress HEENT: Eyes: no retinal hemorrhage or exudates,  Neck supple, trachea midline, no carotid bruits, no thyroidmegaly Lungs: Clear to auscultation, no rhonchi or wheezes, or rib retractions  Heart: Regular rate and rhythm, no murmurs or gallops Breast:Examined in sitting and supine position were symmetrical in appearance, no palpable masses or tenderness,  no skin retraction, no nipple inversion, no nipple discharge, no skin discoloration, no axillary or supraclavicular lymphadenopathy Abdomen: no palpable masses or tenderness, no rebound or guarding Extremities: no edema or skin discoloration or tenderness  Pelvic: Vulva: Normal             Vagina: No gross lesions or discharge  Cervix: No gross lesions or discharge.  Pap/HPV HR done.  Uterus  AV, normal size, shape and consistency, non-tender and mobile  Adnexa  Without masses or tenderness  Anus: Normal  [facility]  [facility]: Uterus: 8.91 x 6.27 x 5.17 cm. anteverted  Endometrial stripe: 6.2 mm trilaminar (DOC #21)  Fibroids: none seen  Endometrial mass: none seen  Cervix: normal  Right ovary: normal, no masses except dominant follicle noted  Left ovary: normal, no masses  Other findings of note: no free fluid and adnexal masses noted    Assessment/Plan:  42 y.o. female for annual exam   1. Encounter for routine gynecological examination with Papanicolaou smear of cervix Longstanding heavy menses monthly.  Heavy flow lasting about 7 days.  Moderate cramping.  Pelvic US Neg  in 05/2020. No improvement in the past with BCPs or Nexplanon.  Currently using condoms.  Never tried the Progesterone IUD.  Paternal cousin had Endometrial Ca in early 55's, patient worried about that family history.  No recent h/o abnormal Pap.  Remote Colposcopy, no treatment required.  Breasts normal.  Will start screening mammo this year at 77.  BMI 26.16.  Urine/BMs normal.  Flu vaccine at PCP. - Cytology - PAP( [place])  2. Menorrhagia with regular cycle Longstanding heavy menses monthly.  Heavy flow lasting about 7 days.  Moderate cramping.  Pelvic US Neg in 05/2020. No improvement in the past with BCPs or Nexplanon.  Currently using condoms.  Never tried the Progesterone IUD.  Paternal cousin had Endometrial Ca in early 64's, patient worried about that family history. CBC r/o anemia.  TSH.  F/U Pelvic US to further investigate.  Will probably do an EBx as well. - CBC - TSH - US Transvaginal Non-OB; Future  3. Use of condoms for contraception Recommend condom use.  Other orders - Multiple Vitamin (MULTIVITAMIN PO); Take by mouth. - Probiotic Product (PROBIOTIC PO); Take by mouth.   Princess Bruins MD, 1:55 PM

## 2022-04-24 LAB — CBC
HCT: 34.8 % — ABNORMAL LOW (ref 35.0–45.0)
Hemoglobin: 10.6 g/dL — ABNORMAL LOW (ref 11.7–15.5)
MCH: 22.6 pg — ABNORMAL LOW (ref 27.0–33.0)
MCHC: 30.5 g/dL — ABNORMAL LOW (ref 32.0–36.0)
MCV: 74.4 fL — ABNORMAL LOW (ref 80.0–100.0)
Platelets: 221 10*3/uL (ref 140–400)
RBC: 4.68 10*6/uL (ref 3.80–5.10)
RDW: 16.3 % — ABNORMAL HIGH (ref 11.0–15.0)
WBC: 6.8 10*3/uL (ref 3.8–10.8)

## 2022-04-24 LAB — TSH: TSH: 1.04 mIU/L

## 2022-04-26 ENCOUNTER — Encounter: Payer: Self-pay | Admitting: Obstetrics & Gynecology

## 2022-05-01 LAB — CYTOLOGY - PAP
Comment: NEGATIVE
Diagnosis: UNDETERMINED — AB
High risk HPV: NEGATIVE

## 2022-05-27 ENCOUNTER — Encounter: Payer: Self-pay | Admitting: Obstetrics & Gynecology

## 2022-05-27 NOTE — Telephone Encounter (Signed)
Per ML: "No, recommend to go to Pelvic US even if bleeding."

## 2022-05-30 ENCOUNTER — Other Ambulatory Visit (HOSPITAL_COMMUNITY)
Admission: RE | Admit: 2022-05-30 | Discharge: 2022-05-30 | Disposition: A | Discharge status: Home / Self Care | Payer: 59 | Source: Ambulatory Visit | Attending: Obstetrics & Gynecology | Admitting: Obstetrics & Gynecology

## 2022-05-30 ENCOUNTER — Ambulatory Visit (INDEPENDENT_AMBULATORY_CARE_PROVIDER_SITE_OTHER): Payer: 59 | Admitting: Obstetrics & Gynecology

## 2022-05-30 ENCOUNTER — Ambulatory Visit (INDEPENDENT_AMBULATORY_CARE_PROVIDER_SITE_OTHER): Payer: 59

## 2022-05-30 ENCOUNTER — Encounter: Payer: Self-pay | Admitting: Obstetrics & Gynecology

## 2022-05-30 VITALS — BP 108/64 | HR 82

## 2022-05-30 DIAGNOSIS — N920 Excessive and frequent menstruation with regular cycle: Secondary | ICD-10-CM

## 2022-05-30 DIAGNOSIS — Z01812 Encounter for preprocedural laboratory examination: Secondary | ICD-10-CM

## 2022-05-30 DIAGNOSIS — Z789 Other specified health status: Secondary | ICD-10-CM

## 2022-05-30 LAB — PREGNANCY, URINE: Preg Test, Ur: NEGATIVE

## 2022-05-30 NOTE — Progress Notes (Signed)
    Christina Lam 10/15/1980 [id number]        42 y.o.  G3P3003   RP: Menorrhagia for Pelvic US/EBx  HPI: Longstanding heavy menses monthly.  Heavy flow lasting about 7 days.  Moderate cramping.  Pelvic US Neg in 05/2020. No improvement in the past with BCPs or Nexplanon.  Currently using condoms.  Never tried the Progesterone IUD.  Paternal cousin had Endometrial Ca in early 35's, patient worried about that family history.    OB History  Gravida Para Term Preterm AB Living  3 3 3     3  $ SAB IAB Ectopic Multiple Live Births          3    # Outcome Date GA Lbr Len/2nd Weight Sex Delivery Anes PTL Lv  3 Term 02/16/13 25w3d07:20 / 00:35 7 lb 3 oz (3.26 kg) M Vag-Spont EPI  LIV  2 Term      Vag-Spont   LIV  1 Term      Vag-Spont   LIV    Past medical history,surgical history, problem list, medications, allergies, family history and social history were all reviewed and documented in the EPIC chart.   Directed ROS with pertinent positives and negatives documented in the history of present illness/assessment and plan.  Exam:  There were no vitals filed for this visit. General appearance:  Normal  Pelvic [place]: T/V images.  Anteverted uterus, normal in size and shape measured at 9.09 x 5.73 x 5.27 cm.  Inhomogeneous myometrium with streaky shadowing suggestive of adenomyosis.  No Myometrial masses.  Symmetrical endometrium measured at 7.3 mm with no masses or thickening seen.  Both ovaries are mobile, normal size with normal follicular pattern and normal perfusion.  A 1.3 cm resolving corpus luteum is present on the right ovary.  No adnexal mass.  No free fluid in the pelvis.  UPT Neg  EBx: Written consent obtained.  Speculum:  Cervix/Vagina normal.  Betadine prep.  Hurricane spray.  Tenaculum on the anterior lip of the cervix.  Endometrial Bx on all IU surfaces with suction.  All instruments removed.  Specimen sent to pathology.  Well tolerated, no Cx.   Assessment/Plan:  42y.o.  G3P3L3   1. Menorrhagia with regular cycle Longstanding heavy menses monthly.  Heavy flow lasting about 7 days.  Moderate cramping.  Pelvic [place] in 05/2020. No improvement in the past with BCPs or Nexplanon.  Currently using condoms.  Never tried the Progesterone IUD.  Paternal cousin had Endometrial Ca in early 523's patient worried about that family history.  Pelvic [place]/w Adenomyosis, otherwise normal.  Endometrial Bx done.  Post procedure precautions.  Management per results.  Will call back if wants the Progesterone IUD.  Endometrial Ablation also discussed. - Surgical pathology( [place]/ [facility])  2. Use of condoms for contraception  3. Encounter for preprocedural laboratory examination UPT Neg - Pregnancy, urine    MPrincess BruinsMD, 12:02 PM 05/30/2022

## 2022-06-03 LAB — SURGICAL PATHOLOGY

## 2022-07-12 ENCOUNTER — Ambulatory Visit: Payer: Self-pay | Admitting: Surgery

## 2022-07-12 NOTE — H&P (Signed)
History of Present Illness: Christina Lam is a 42 y.o. female who was referred to me for evaluation of a pilonidal cyst. She says she first began having symptoms about 17 years ago while living in [place], and has intermittently had flares. She moved to [place] about 10 years ago and for a while had no symptoms. Last October she developed severe pain and an abscess, and underwent an I&D. Since then she has intermittently had a knot in the area. She was recently treated with a course of oral antibiotics by her PCP, which she says improved the symptoms. In between flares she has minimal symptoms. She notices more discomfort after working out.   She is overall in good health. She has not previously had any pilonidal surgery.         Review of Systems: A complete review of systems was obtained from the patient.  I have reviewed this information and discussed as appropriate with the patient.  See HPI as well for other ROS.       Medical History: Past Medical History Past Medical History: Diagnosis Date  Anemia    Anxiety    Seizures (CMS/HHS-HCC)        There is no problem list on file for this patient.     Past Surgical History History reviewed. No pertinent surgical history.     Allergies Allergies Allergen Reactions  Penicillins Hives and Rash     historical EHR allergy import   Unknown pt. Was told she is allergic to pcn as a baby   Childhood reaction  Has patient had a PCN reaction causing immediate rash, facial/tongue/throat swelling, SOB or lightheadedness with hypotension: Yes  Has patient had a PCN reaction causing severe rash involving mucus membranes or skin necrosis: No  Has patient had a PCN reaction that required hospitalization No  Has patient had a PCN reaction occurring within the last 10 years: No  If all of the above answers are "NO", then may proceed with Cephalosporin use.      No current outpatient medications on file prior to visit.   No current  facility-administered medications on file prior to visit.     Family History Family History Problem Relation Age of Onset  High blood pressure (Hypertension) Mother    High blood pressure (Hypertension) Father        Social History   Tobacco Use Smoking Status Never Smokeless Tobacco Never     Social History Social History    Socioeconomic History  Marital status: Married Tobacco Use  Smoking status: Never  Smokeless tobacco: Never Substance and Sexual Activity  Alcohol use: Yes  Drug use: Never      Objective:     Vitals:   07/12/22 1030 BP: 118/76 Pulse: 56 Temp: 37.1 C (98.7 F) SpO2: 99% Weight: 71.6 kg (157 lb 12.8 oz) Height: 165.1 cm ( )   Body mass index is 26.26 kg/m.   Physical Exam Vitals reviewed.  Constitutional:      General: She is not in acute distress.    Appearance: Normal appearance.  HENT:     Head: Normocephalic and atraumatic.  Eyes:     General: No scleral icterus.    Conjunctiva/sclera: Conjunctivae normal.  Cardiovascular:     Rate and Rhythm: Normal rate and regular rhythm.  Pulmonary:     Effort: Pulmonary effort is normal. No respiratory distress.     Breath sounds: Normal breath sounds. No wheezing.  Genitourinary:    Comments: Single pit in  the superior gluteal cleft at midline. No fluctuance, erythema or induration. Well-healed scar to the left of midline. Musculoskeletal:        General: Normal range of motion.     Cervical back: Normal range of motion.  Skin:    General: Skin is warm and dry.  Neurological:     General: No focal deficit present.     Mental Status: She is alert and oriented to person, place, and time.  Psychiatric:        Mood and Affect: Mood normal.        Behavior: Behavior normal.          Assessment and Plan: Diagnoses and all orders for this visit:   Pilonidal disease       This is a 42 yo female referred with long-standing pilonidal disease, with intermittent flares. On  exam today, disease appears minimal with no active infection or inflammation, however she has had intermittent flares for many years. I reviewed treatment options including surgical non-surgical. Non-surgical would include hair removal and aggressive local hygiene, I.e. showering right after workouts to minimize sweat and debris in area. For surgical treatment, given minimal extent I would favor excision of the pilonidal sinus and allow closure by secondary intention. I discussed this would mean having a small open wound postoperatively. She would like to proceed with the scheduling process. She will be contacted to schedule an elective surgery date.  Sophronia Simas, MD [facility] General, Hepatobiliary and Pancreatic Surgery 07/12/22 2:58 PM

## 2023-05-06 ENCOUNTER — Other Ambulatory Visit: Payer: Self-pay | Admitting: Radiology

## 2023-05-07 LAB — SURGICAL PATHOLOGY
